# Patient Record
Sex: Male | Born: 1960 | Race: White | Hispanic: No | Marital: Married | Smoking: Never smoker
Health system: Southern US, Academic
[De-identification: ages and names within clinical notes are randomized; demographics above are authoritative.]

## PROBLEM LIST (undated history)

## (undated) DIAGNOSIS — I10 Essential (primary) hypertension: Secondary | ICD-10-CM

## (undated) DIAGNOSIS — E7800 Pure hypercholesterolemia, unspecified: Secondary | ICD-10-CM

## (undated) HISTORY — PX: HX KNEE ARTHROSCOPY: SHX127

## (undated) HISTORY — PX: HX TONSILLECTOMY: SHX27

---

## 2000-03-12 ENCOUNTER — Emergency Department (HOSPITAL_COMMUNITY): Payer: Self-pay | Admitting: Emergency Medicine

## 2020-09-09 IMAGING — MR MRI LUMBAR SPINE WITHOUT CONTRAST
6 series · 48 of 48 positions shown · IV contrast (gadolinium)
Comparison: None available.

﻿EXAM:  [id number]   MRI LUMBAR SPINE WITHOUT CONTRAST
INDICATION: Chronic lower back pain.
TECHNIQUE: Multiplanar multisequential MRI of the lumbar spine was performed without gadolinium contrast. A non conventional body coil was utilized due to patient's extremely large body habitus.

[Series 4: T2 · sagittal · 5.0mm · 1.00mm/px · 5 of 13 slices shown (1 of 3)]
[im 1/13]
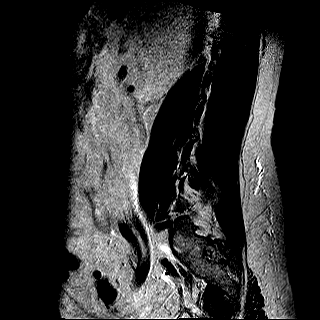
[im 4/13]
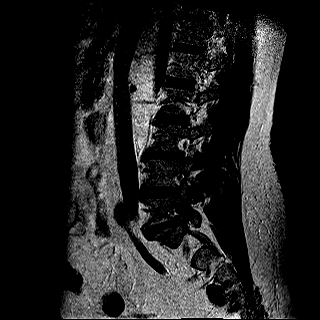
[im 7/13]
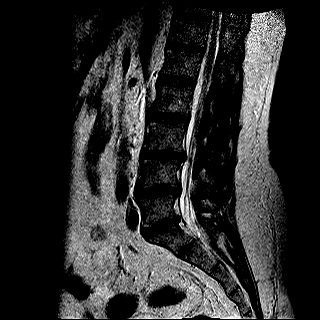
[im 10/13]
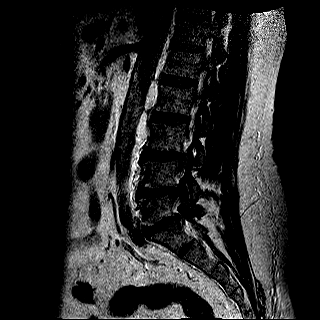
[im 13/13]
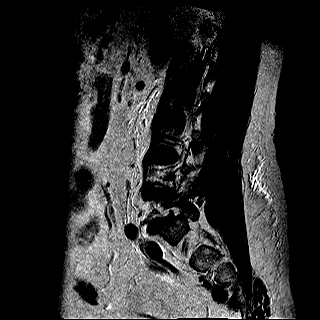

[Series 5: T1 · sagittal · 5.0mm · 1.00mm/px · 6 of 13 slices shown (1 of 2)]
[im 1/13]
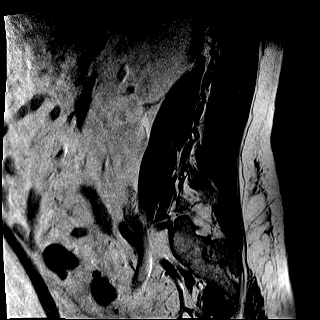
[im 3/13]
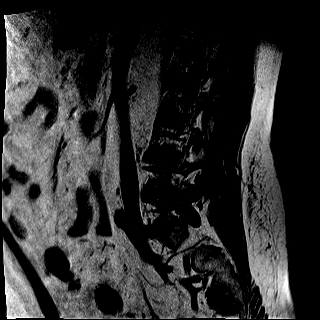
[im 5/13]
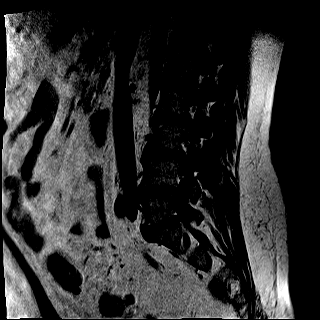
[im 8/13]
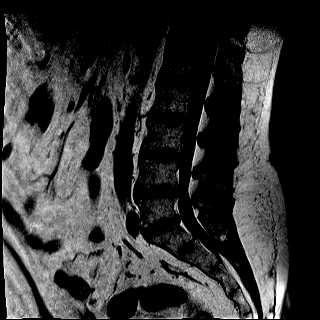
[im 10/13]
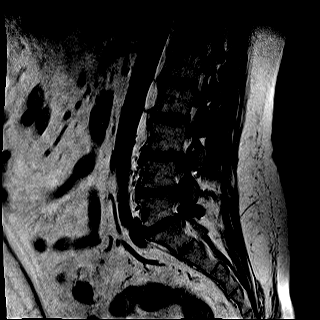
[im 13/13]
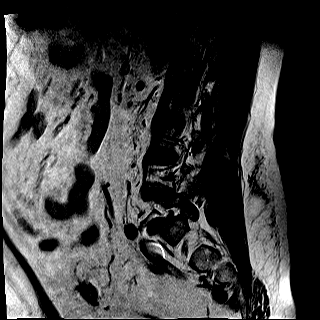

[Series 6: STIR · sagittal · 5.0mm · 1.25mm/px · 6 of 13 slices shown]
[im 1/13]
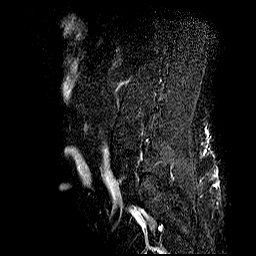
[im 3/13]
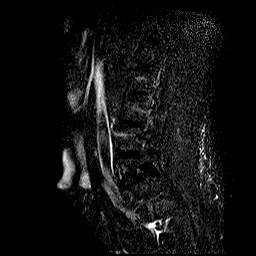
[im 5/13]
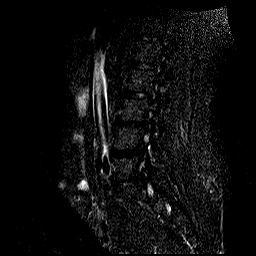
[im 8/13]
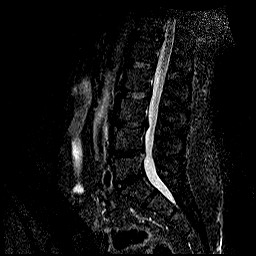
[im 10/13]
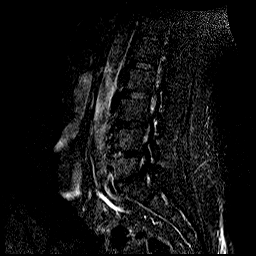
[im 13/13]
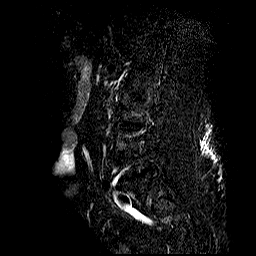

[Series 7: T2 · coronal · 4.0mm · 1.34mm/px · 9 of 20 slices shown (2 of 3)]
[im 1/20]
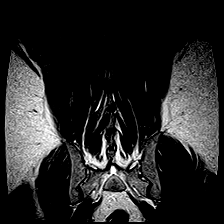
[im 3/20]
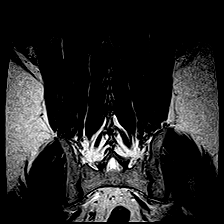
[im 5/20]
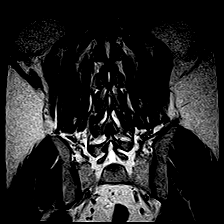
[im 8/20]
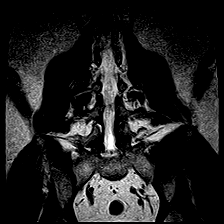
[im 10/20]
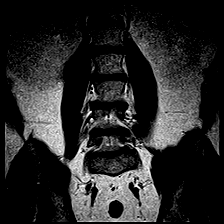
[im 12/20]
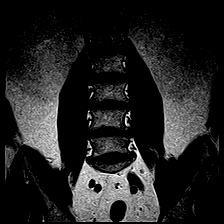
[im 15/20]
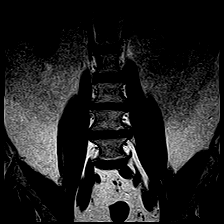
[im 17/20]
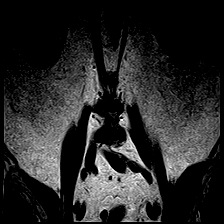
[im 20/20]
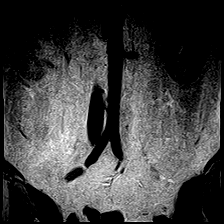

[Series 8: T2 · axial · 5.0mm · 0.89mm/px · z∈[-78,+136]mm · 11 of 25 slices shown (3 of 3)]
[im 1/25]
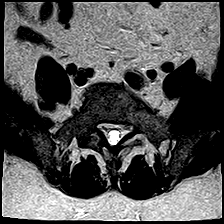
[im 3/25]
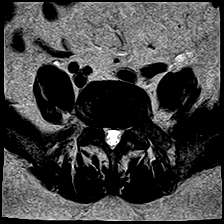
[im 5/25]
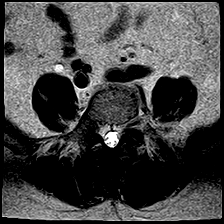
[im 8/25]
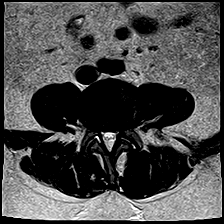
[im 10/25]
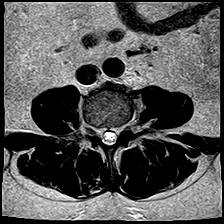
[im 13/25]
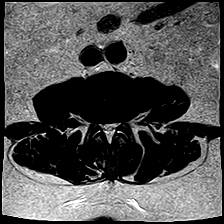
[im 15/25]
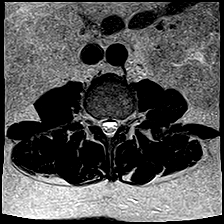
[im 17/25]
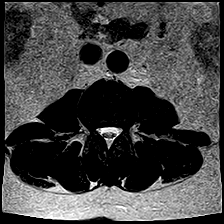
[im 20/25]
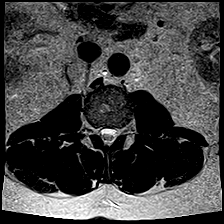
[im 22/25]
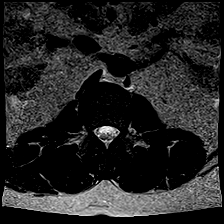
[im 25/25]
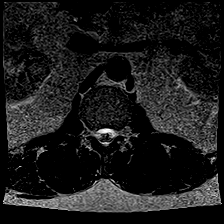

[Series 9: T1 · axial · 5.0mm · 0.89mm/px · z∈[-78,+136]mm · 11 of 25 slices shown (2 of 2)]
[im 1/25]
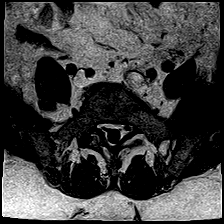
[im 3/25]
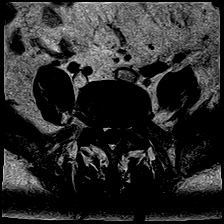
[im 5/25]
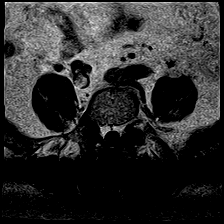
[im 8/25]
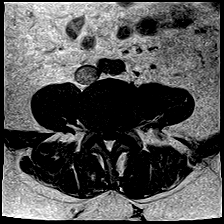
[im 10/25]
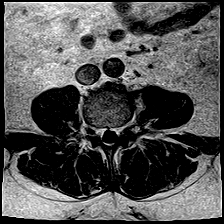
[im 13/25]
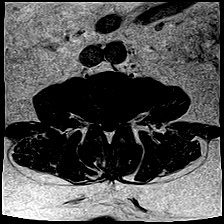
[im 15/25]
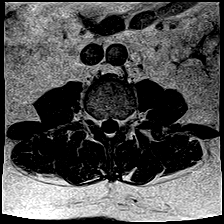
[im 17/25]
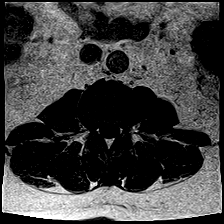
[im 20/25]
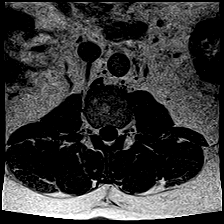
[im 22/25]
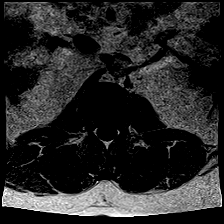
[im 25/25]
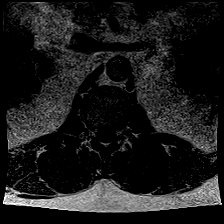

[48 of 48 positions shown; findings below may reference images not displayed]

FINDINGS: Vertebral bodies are normal in height, alignment and signal intensity. There is no acute fracture or subluxation. Distal spinal cord is normal in signal intensity and terminates normally at L1-2 disc space level. Spinal canal is congenitally narrow.

L1-2 level is unremarkable.

At L2-3 level, there is a minimal bulging annulus, minimally effacing the ventral thecal sac. There is mild bilateral neural foraminal stenosis from facet arthropathy and bulging annulus without nerve root impingement.

At L3-4 level, there is a small broad-based central disc bulge with associated epidural lipomatosis resulting in mild-to-moderate spinal stenosis. There is mild bilateral neural foraminal stenosis from facet arthropathy and bulging annulus.

At L4-5 level, there is a small broad-based central and left paracentral disc bulge resulting in moderate to severe left lateral recess compression. There is moderate bilateral neural foraminal stenosis from facet arthropathy and bulging annulus.

At L5-S1 level, there is a small broad-based central disc bulge without mass effect on the thecal sac. There is moderate bilateral neural foraminal stenosis from facet arthropathy and bulging annulus.

Paraspinal soft tissues are unremarkable.
IMPRESSION: 1. Mild-to-moderate spinal stenosis at L3-4 level from a small central disc bulge and associated epidural lipomatosis. 

2. Moderate to severe left lateral recess compression at L4-5 level from a small central and left paracentral disc bulge. 

3. Multilevel neural foraminal stenosis as detailed above.

## 2022-03-28 IMAGING — MR MRI KNEE LT W/O CONTRAST
5 series · 37 of 40 positions shown · IV contrast (gadolinium)
Comparison: None available.

﻿EXAM:  [id number]   MRI KNEE LT W/O CONTRAST
INDICATION: 61-year-old with history of trauma at work recently, sustaining twisting injury.  Medial knee pain and swelling.  Suspected internal derangement.
TECHNIQUE: Multiplanar, multisequential MRI of the left knee was performed without gadolinium contrast.  Spine coil was used due to the body habitus.

[Series 5: PD fat-sat · axial · left · 4.5mm · 0.37mm/px · z∈[-133,-8]mm · 7 of 26 slices shown (1 of 3)]
[im 1/26]
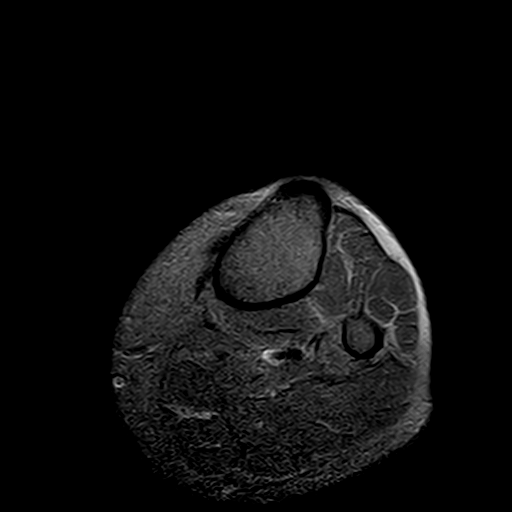
[im 5/26]
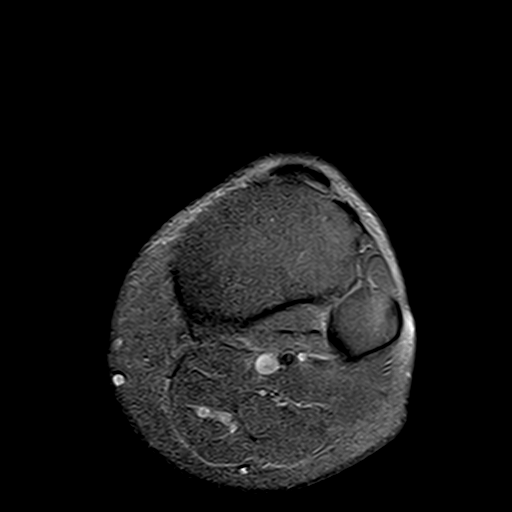
[im 9/26]
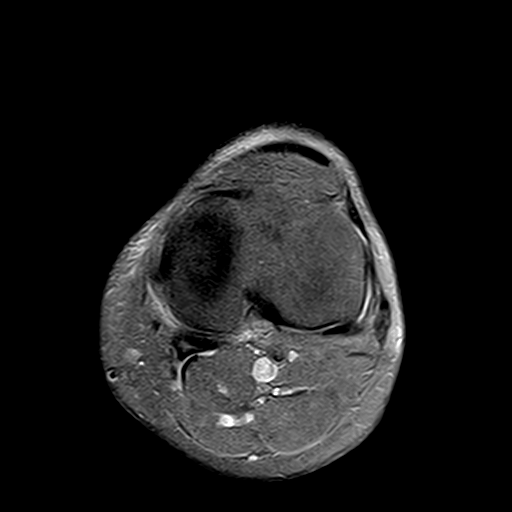
[im 13/26]
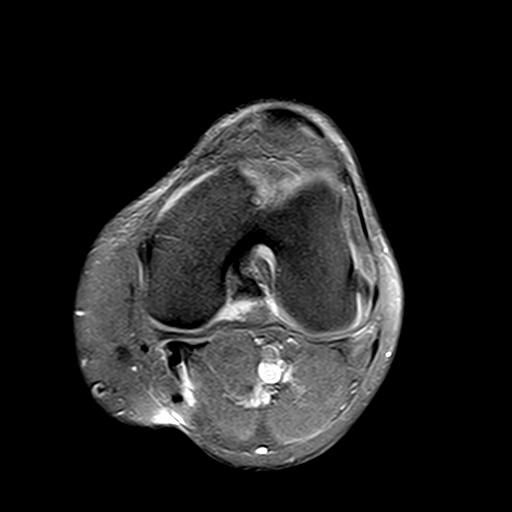
[im 17/26]
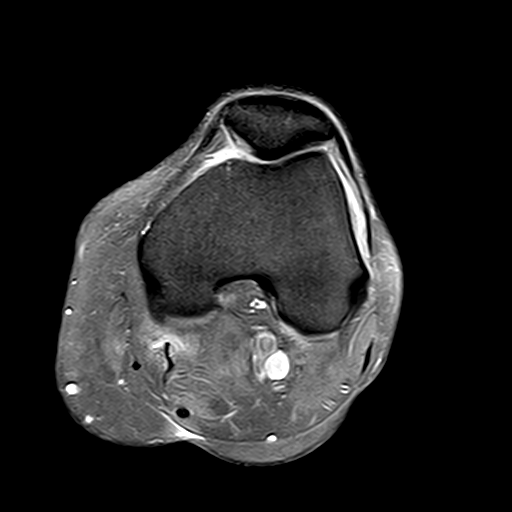
[im 21/26]
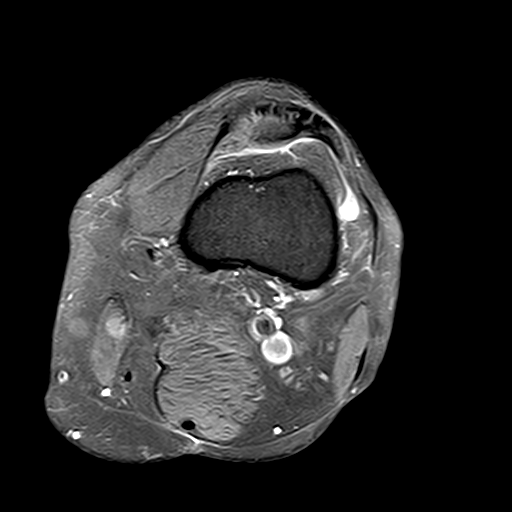
[im 26/26]
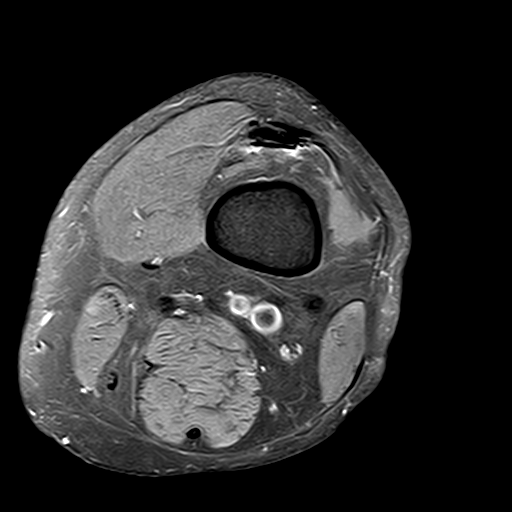

[Series 6: PD fat-sat · sagittal · left · 4.0mm · 0.53mm/px · 7 of 26 slices shown (2 of 3)]
[im 1/26]
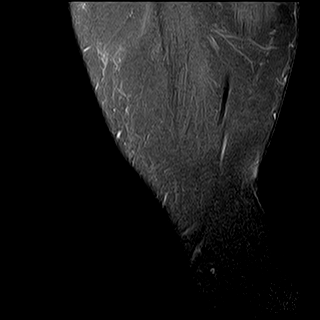
[im 5/26]
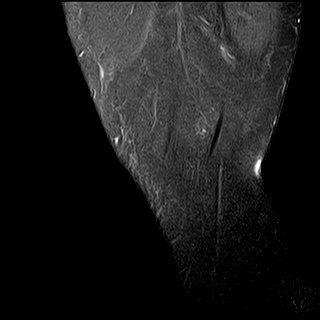
[im 9/26]
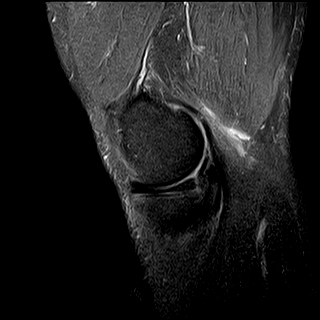
[im 13/26]
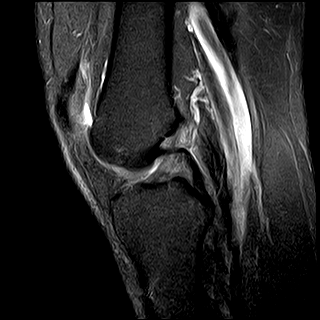
[im 17/26]
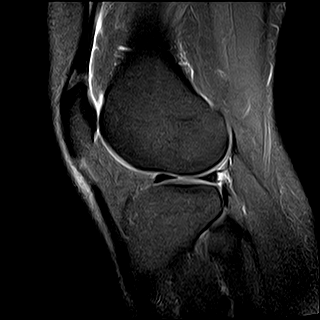
[im 21/26]
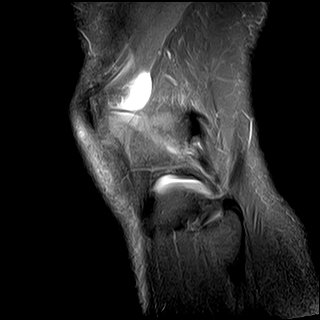
[im 26/26]
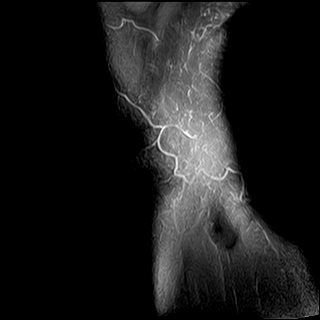

[Series 7: T1 · sagittal · left · 4.0mm · 0.53mm/px · 8 of 26 slices shown]
[im 1/26]
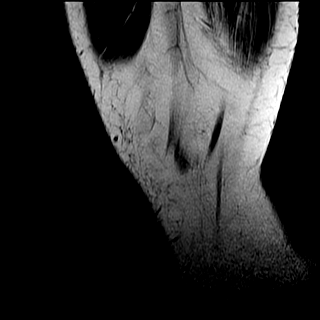
[im 4/26]
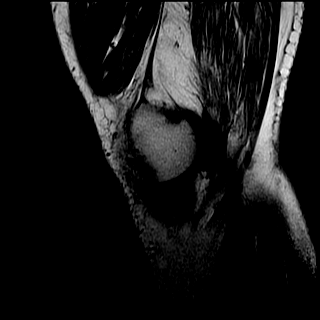
[im 8/26]
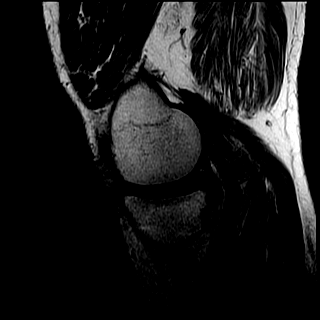
[im 11/26]
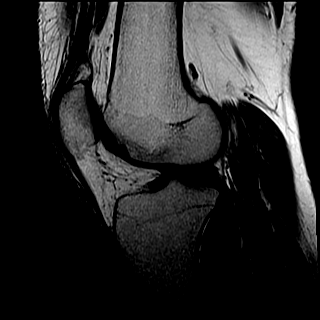
[im 15/26]
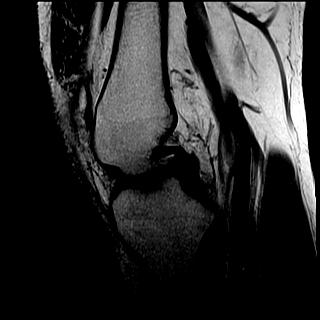
[im 18/26]
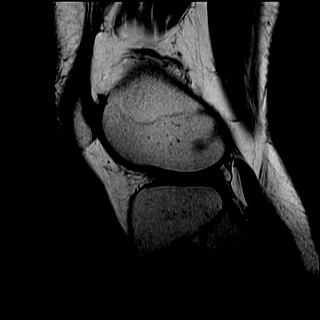
[im 22/26]
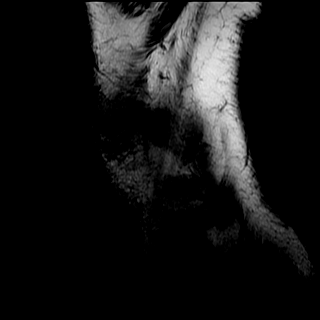
[im 26/26]
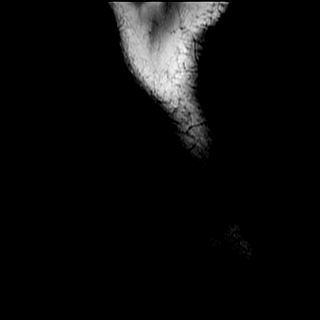

[Series 8: STIR · coronal · left · 4.0mm · 0.59mm/px · 6 of 30 slices shown]
[im 1/30]
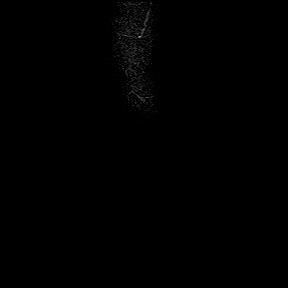
[im 4/30]
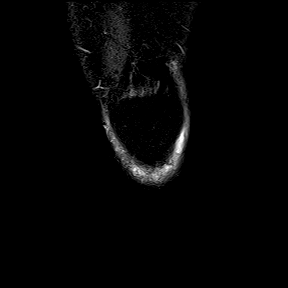
[im 8/30]
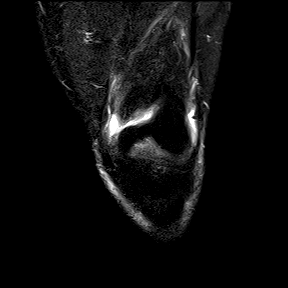
[im 11/30]
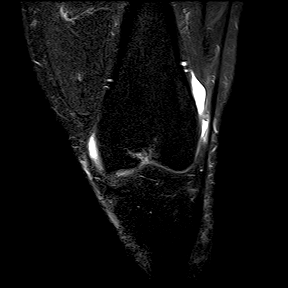
[im 19/30]
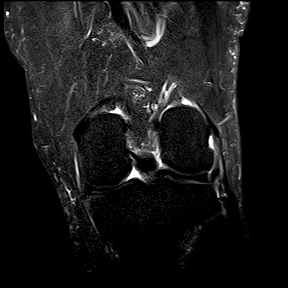
[im 22/30]
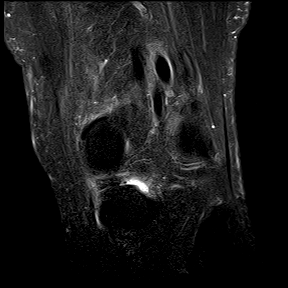

[Series 9: PD fat-sat · coronal · left · 4.0mm · 0.53mm/px · 9 of 30 slices shown (3 of 3)]
[im 1/30]
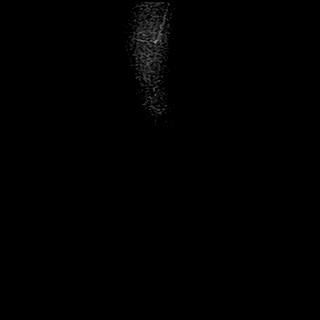
[im 4/30]
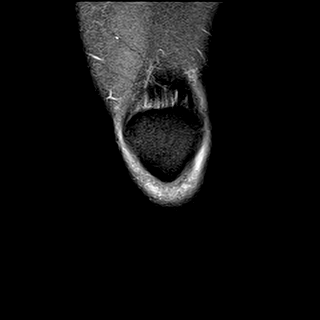
[im 8/30]
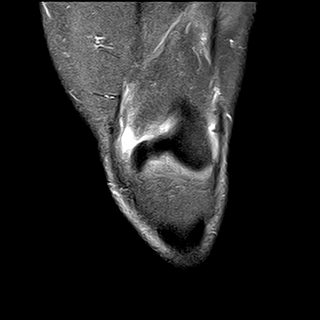
[im 11/30]
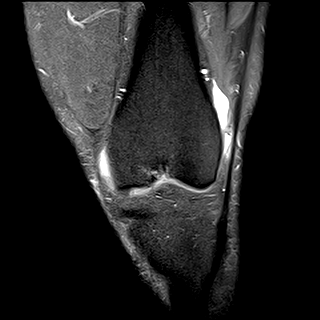
[im 15/30]
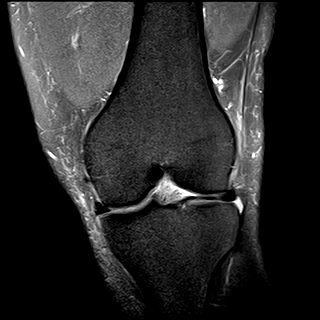
[im 19/30]
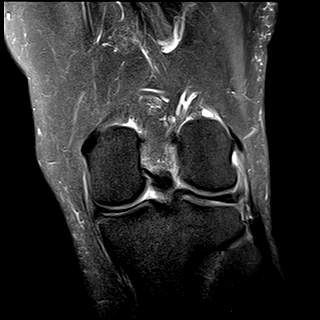
[im 22/30]
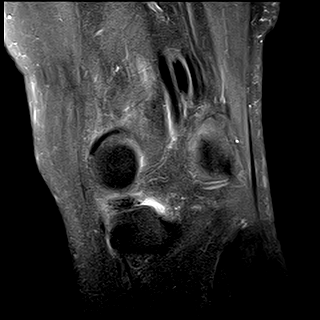
[im 26/30]
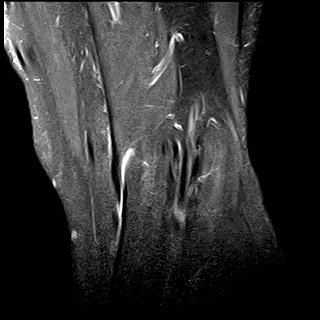
[im 30/30]
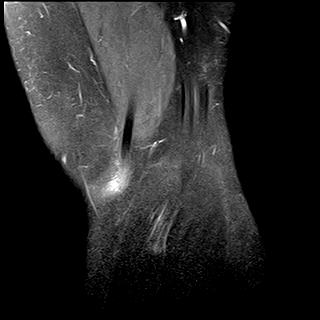

[37 of 40 positions shown; findings below may reference images not displayed]

FINDINGS: No acute bony lesions on both sides of the left knee. 

The lateral meniscus and lateral articular cartilage are intact.  The anterior and posterior cruciate ligaments are intact.

No meniscal tear of the medial meniscus is seen. Meniscal root is intact.

No focal abnormalities of the patellofemoral articulation.  Small effusion is noted in the knee joint. 

Mild soft tissue bruising and edema are noted over the medial collateral ligament.  However, there is no evidence of MCL disruption.  Lateral ligaments are intact. Quadriceps tendon and patellar tendon are intact.
IMPRESSION: 1. No acute fracture or bone bruise at the left knee.

2. No evidence of internal derangement of menisci, cruciate ligaments are seen. 

3. Bruising around the medial collateral ligament is noted with edema.  No evidence of disruption of the MCL.  Findings are suggestive of grade 1 sprained MCL.  

4. Small effusion in the knee joint.

## 2022-08-28 ENCOUNTER — Emergency Department
Admission: EM | Admit: 2022-08-28 | Discharge: 2022-08-28 | Disposition: A | Discharge status: Home / Self Care | Payer: 59 | Attending: Emergency Medicine | Admitting: Emergency Medicine

## 2022-08-28 ENCOUNTER — Emergency Department (HOSPITAL_COMMUNITY): Payer: 59

## 2022-08-28 ENCOUNTER — Encounter (HOSPITAL_COMMUNITY): Payer: Self-pay

## 2022-08-28 ENCOUNTER — Other Ambulatory Visit: Payer: Self-pay

## 2022-08-28 DIAGNOSIS — S46911A Strain of unspecified muscle, fascia and tendon at shoulder and upper arm level, right arm, initial encounter: Secondary | ICD-10-CM

## 2022-08-28 DIAGNOSIS — W19XXXA Unspecified fall, initial encounter: Secondary | ICD-10-CM | POA: Insufficient documentation

## 2022-08-28 HISTORY — DX: Essential (primary) hypertension: I10

## 2022-08-28 HISTORY — DX: Pure hypercholesterolemia, unspecified: E78.00

## 2022-08-28 MED ORDER — TRAMADOL 50 MG TABLET
2.0000 | ORAL_TABLET | Freq: Four times a day (QID) | ORAL | 0 refills | Status: AC | PRN
Start: 2022-08-28 — End: 2022-09-02

## 2022-08-28 MED ORDER — FENTANYL (PF) 50 MCG/ML INJECTION SOLUTION
50.0000 ug | INTRAMUSCULAR | Status: AC
Start: 2022-08-28 — End: 2022-08-28
  Administered 2022-08-28: 50 ug via INTRAVENOUS

## 2022-08-28 MED ORDER — ONDANSETRON HCL (PF) 4 MG/2 ML INJECTION SOLUTION
4.0000 mg | INTRAMUSCULAR | Status: AC
Start: 2022-08-28 — End: 2022-08-28
  Administered 2022-08-28: 4 mg via INTRAVENOUS

## 2022-08-28 MED ORDER — ONDANSETRON HCL (PF) 4 MG/2 ML INJECTION SOLUTION
INTRAMUSCULAR | Status: AC
Start: 2022-08-28 — End: 2022-08-28
  Filled 2022-08-28: qty 2

## 2022-08-28 MED ORDER — FENTANYL (PF) 50 MCG/ML INJECTION SOLUTION
INTRAMUSCULAR | Status: AC
Start: 2022-08-28 — End: 2022-08-28
  Filled 2022-08-28: qty 2

## 2022-08-28 NOTE — ED Nurses Note (Signed)
Patient to ed room 12 with c/o falling down a hill and hitting his shoulder. Obvious deformity of the left shoulder ntd. Last known meal was at 1430.

## 2022-08-28 NOTE — ED Nurses Note (Addendum)
Sling applied to patient right arm.

## 2022-08-28 NOTE — ED Triage Notes (Signed)
C/O R SHOULDER PAIN AFTER FALL AT APPROX 1700. NOTED DEFORMITY.

## 2022-08-28 NOTE — Discharge Instructions (Addendum)
THE PATIENT IS TO FOLLOW UP WITH THE PRIMARY CARE PROVIDER AS SOON AS POSSIBLE BUT NO LATER THAN 3 DAYS FROM LEAVING THE EMERGENCY DEPARTMENT.  IF NO PRIMARY CARE PROVIDER EXISTS, THEN THE PATIENT IS INSTRUCTED TO ESTABLISH CARE WITH A PRIMARY CARE PROVIDER AS SOON AS POSSIBLE BUT NO LATER THAN 3 DAYS FROM LEAVING THE EMERGENCY DEPARTMENT.  FOLLOW-UP WITH ANY SPECIALIST PROVIDER AS INDICATED AS SOON AS POSSIBLE BUT NO LATER THAN 3 DAYS, IF APPLICABLE.  NOTIFY THE PRIMARY CARE PROVIDER THAT YOU WERE IN THE EMERGENCY DEPARTMENT WITHIN 24 HOURS OF DISCHARGE TO FOLLOW-UP ON YOUR RESULTS AND/OR TREATMENTS.  RETURN TO THE EMERGENCY DEPARTMENT IMMEDIATELY IF NEEDED, NO BETTER, WORSE, NEW SYMPTOMS ARISE, OR YOU CANNOT FOLLOW-UP WITH YOUR PRIMARY CARE PROVIDER AND/OR APPLICABLE SPECIALIST IN THE PRESCRIBED TIMEFRAME.      CAN CALL DR. MORGAN'S OFFICE IN THE MORNING TO GET AN APPOINTMENT.

## 2022-08-28 NOTE — ED Provider Notes (Signed)
[facility]  Emergency Department  Attending Provider Note      CHIEF COMPLAINT  Chief Complaint   Patient presents with    Shoulder Pain     HISTORY OF PRESENT ILLNESS  Arthur Campbell, date of birth January 26, 1961, is a 62 y.o. male who presented to the Emergency Department.    PATIENT FELL PRIOR TO ARRIVAL WITH INJURY TO THE RIGHT SHOULDER COMPLEX.  SYMPTOMS WORSE WITH ACTIVITY BETTER WITH REST MODERATE IN NATURE.  CONCERN FOR DISLOCATION.  NO FEVERS CHILLS CHEST PAIN PALPITATION SHORTNESS OF BREATH COUGH DYSURIA HEMATURIA NAUSEA VOMITING.  NO LOSS OF CONSCIOUSNESS OR HEAD TRAUMA.  COMPREHENSIVE 10+ REVIEW OF SYSTEMS OTHERWISE NEGATIVE.  THERE ARE NO OTHER ACUTE COMPLAINTS REPORTED BY THE PATIENT.    PAST MEDICAL/SURGICAL/FAMILY/SOCIAL HISTORY  Past Medical History:   Diagnosis Date    High cholesterol     HTN (hypertension)        Past Surgical History:   Procedure Laterality Date    HX KNEE ARTHROSCOPY      HX TONSILLECTOMY         Family Medical History:    None       Social History     Socioeconomic History    Marital status: Married   Tobacco Use    Smoking status: Never    Smokeless tobacco: Never   Substance and Sexual Activity    Alcohol use: Never    Drug use: Never      ALLERGIES  Allergies   Allergen Reactions    Codeine Anaphylaxis       PHYSICAL EXAM  VITAL SIGNS:  Filed Vitals:    08/28/22 1726   BP: (!) 166/97   Pulse: 84   Resp: 20   Temp: 36.8 C (98.2 F)   SpO2: 97%     GENERAL: PATIENT IS ALERT AND ORIENTED TO PERSON, PLACE, AND TIME.  HEAD: NORMOCEPHALIC AND ATRAUMATIC.  EYES: PUPILS EQUALLY ROUND AND REACT TO LIGHT. EXTRAOCULAR MOVEMENTS INTACT.  EARS: GROSS HEARING INTACT. EXTERNAL EARS WITHIN NORMAL LIMITS.  NOSE: NO SEPTAL DEVIATION. NASAL PASSAGES CLEAR.  THROAT: MOIST ORAL MUCOSA.   NECK: SUPPLE. TRACHEA MIDLINE.  HEART: REGULAR, RATE, AND RHYTHM.  LUNGS: CLEAR TO AUSCULTATION BILATERAL BUT DECREASED.  ABDOMEN: SOFT, NON-TENDER, NON-DISTENDED, AND BOWEL SOUNDS ARE PRESENT.   OVERWEIGHT.  GENITOURINARY: DEFERRED.  RECTAL: DEFERRED.  EXTREMITIES: NO CYANOSIS, CLUBBING, OR EDEMA.  SKIN: WARM AND DRY.  MUSCULOSKELETAL:  PAIN TO PALPATION OF THE RIGHT SHOULDER REGION WITH DECREASED RANGE OF MOTION.  NEUROLOGIC: CRANIAL NERVES II THROUGH XII ARE GROSSLY INTACT. SENSATION TO LIGHT TOUCH IS INTACT.  PSYCHIATRIC: JUDGMENT AND INSIGHT ARE SEEMINGLY INTACT. MOOD AND AFFECT ARE APPROPRIATE FOR THE SITUATION.      DIAGNOSTICS  Labs:  Labs listed below were reviewed and interpreted by me.  No results found for any visits on 08/28/22.  Radiology:  Results for orders placed or performed during the hospital encounter of 08/28/22   XR SHOULDER RIGHT     Status: None    Narrative    Temple A Tinnel    RADIOLOGIST: Markus Jarvis, MD    XR SHOULDER RIGHT performed on 08/28/2022 6:09 PM    CLINICAL HISTORY: FALL.  RIGHT SHOULDER PAIN, FALL    TECHNIQUE: 3 view(s) of the right shoulder    COMPARISON:  None.    FINDINGS:   Small bone fragments can be seen distal to the clavicle.  There is malalignment of the acromioclavicular joint with surrounding  soft tissue swelling.  Remaining bony structures and surrounding soft tissues were unremarkable.        Impression    PROBABLE ACUTE AC SEPARATION GIVEN THE HISTORY WITH SURROUNDING SOFT TISSUE SWELLING.           Radiologist location ID: [id number]     XR CHEST AP     Status: None    Narrative    Farid A Vanderbeck    RADIOLOGIST: Markus Jarvis, MD    XR CHEST AP performed on 08/28/2022 6:10 PM    CLINICAL HISTORY: FALL.  RIGHT SHOULDER PAIN, FALL    TECHNIQUE: Frontal view of the chest.    COMPARISON:  Yesterday    FINDINGS:    The heart size is normal.   The lungs are clear.           Impression    NO ACUTE FINDINGS.        Radiologist location ID: [id number]         ED COURSE/MEDICAL DECISION MAKING  Medications Administered in the ED   fentaNYL (SUBLIMAZE) 50 mcg/mL injection (50 mcg Intravenous Given 08/28/22 1800)   ondansetron (ZOFRAN) 2 mg/mL  injection (4 mg Intravenous Given 08/28/22 1810)      ED Course as of 08/28/22 1902   Sun Aug 28, 2022   1824 XR CHEST AP  TODAY I, Lyann Hagstrom A. Jwan Hornbaker, D.O., PERSONALLY VISUALIZED THE PATIENT'S DIAGNOSTIC IMAGING STUDIES.  DEFER TO THE RADIOLOGIST'S REPORT FOR THE FINAL DEFINITIVE RADIOLOGY READING.  MY INITIAL INDEPENDENT INTERPRETATION IS AS FOLLOWS, BUT NOT LIMITED TO:  NO OBVIOUS ACUTE CARDIOPULMONARY PATHOLOGY        Medical Decision Making  SPOKE WITH ORTHOPEDIC SURGERY, GREG, WHO STATED PLACE THE PATIENT IN HIS SLING IN THE PATIENT CAN FOLLOW UP IN THE OFFICE TOMORROW AFTER CALLING TO MAKE AN APPOINTMENT.    Problems Addressed:  Fall: acute illness or injury  Strain of AC joint, right, initial encounter: acute illness or injury    Amount and/or Complexity of Data Reviewed  Independent Historian: spouse  Radiology: ordered and independent interpretation performed. Decision-making details documented in ED Course.    Risk  Prescription drug management.  Parenteral controlled substances.  Diagnosis or treatment significantly limited by social determinants of health.      CLINICAL IMPRESSION  Clinical Impression   Strain of AC joint, right, initial encounter (Primary)   Fall     DISPOSITION  Discharged       DISCHARGE MEDICATIONS  Current Discharge Medication List        START taking these medications    Details   traMADoL (ULTRAM) 50 mg Oral Tablet Take 2 Tablets (100 mg total) by mouth Every 6 hours as needed for Pain for up to 5 days  Qty: 40 Tablet, Refills: 0             /Khalie Wince A. Earlene Plater, DO, MBA   08/28/2022, 18:58   [facility]  Department of Emergency Medicine  [facility]    This note was partially generated using MModal Fluency Direct system, and there may be some incorrect words, spellings, and punctuation that were not noted in checking the note before saving.

## 2022-08-28 NOTE — ED Nurses Note (Signed)
DISCHARGE INSTRUCTIONS AND RX GIVEN, IV REMOVED, BLEEDING CONTROLLED, BANDAGE APPLIED.  VERBALIZED UNDERSTANDING

## 2022-08-29 ENCOUNTER — Other Ambulatory Visit (HOSPITAL_COMMUNITY): Payer: Self-pay | Admitting: Family Medicine

## 2022-08-29 DIAGNOSIS — S43101S Unspecified dislocation of right acromioclavicular joint, sequela: Secondary | ICD-10-CM

## 2022-09-13 ENCOUNTER — Ambulatory Visit (HOSPITAL_COMMUNITY): Payer: Self-pay

## 2022-09-13 ENCOUNTER — Inpatient Hospital Stay (HOSPITAL_COMMUNITY): Admission: RE | Admit: 2022-09-13 | Payer: 59 | Source: Ambulatory Visit

## 2022-12-09 IMAGING — MR MRI LUMBAR SPINE WITHOUT CONTRAST
6 series · 48 of 48 positions shown · IV contrast (gadolinium)
Comparison: MRI dated 09/09/2020.

﻿EXAM:  [id number]   MRI LUMBAR SPINE WITHOUT CONTRAST
INDICATION: Chronic low back pain.  Radicular symptoms to lower extremity with weakness.  Trauma due to fall 2 years ago. No history of malignancy or back surgery.
TECHNIQUE: Multiplanar, multisequential MRI of the lumbosacral spine was performed without gadolinium contrast.  Overall visualization is suboptimal due to large body habitus and the use of body coil instead of spine coil due to the large body habitus.  Overall visualization is acceptable.

[Series 7: T2 · sagittal · 5.0mm · 1.00mm/px · 6 of 13 slices shown (1 of 3)]
[im 1/13]
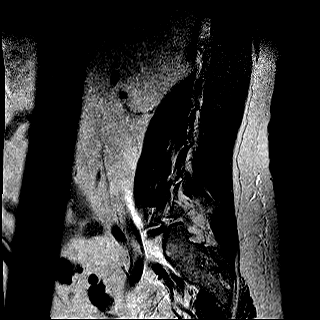
[im 3/13]
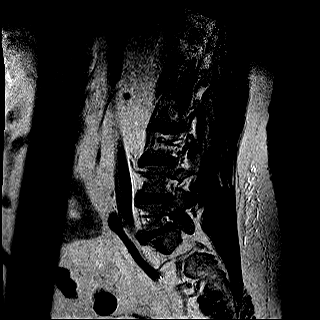
[im 5/13]
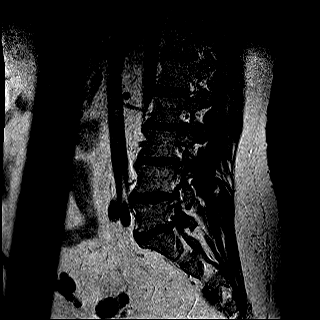
[im 8/13]
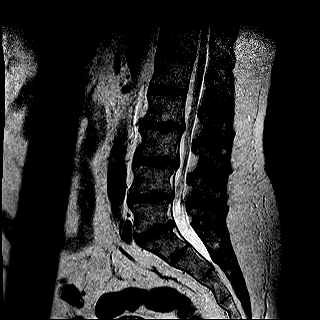
[im 10/13]
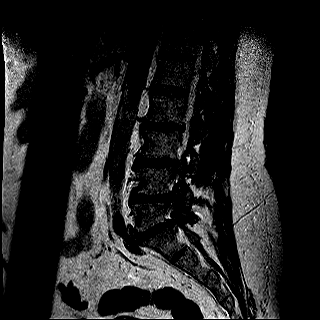
[im 13/13]
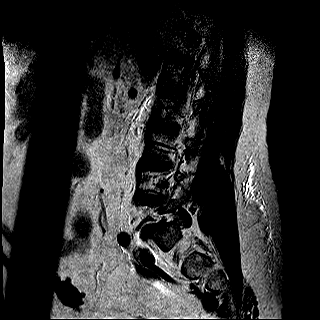

[Series 8: T1 · sagittal · 5.0mm · 1.00mm/px · 6 of 13 slices shown (1 of 2)]
[im 1/13]
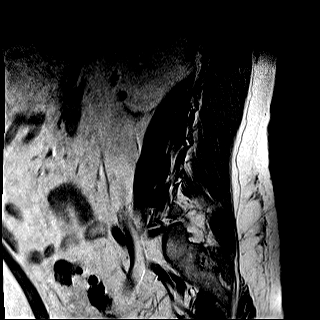
[im 3/13]
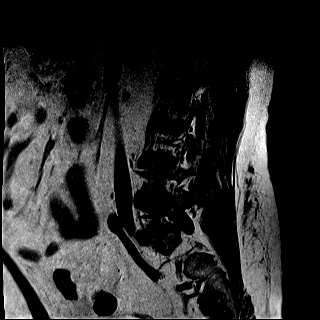
[im 5/13]
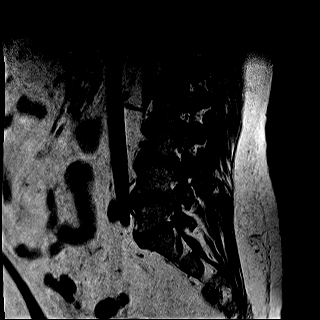
[im 8/13]
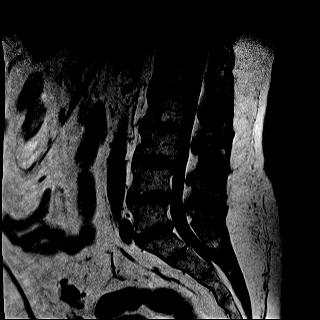
[im 10/13]
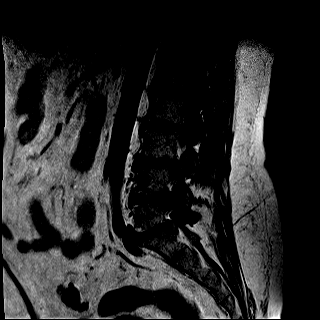
[im 13/13]
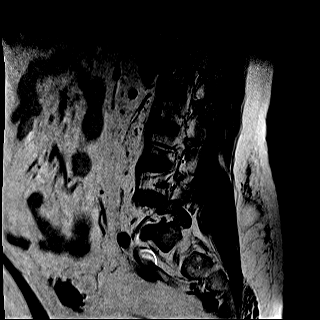

[Series 9: STIR · sagittal · 5.0mm · 1.25mm/px · 6 of 13 slices shown]
[im 1/13]
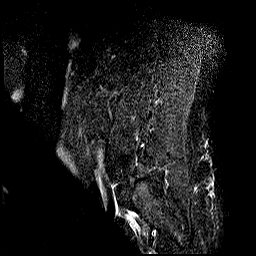
[im 3/13]
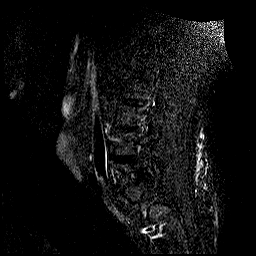
[im 5/13]
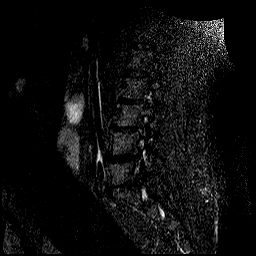
[im 8/13]
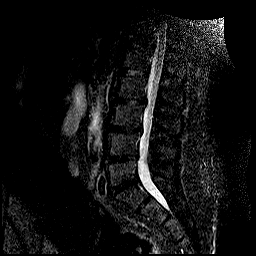
[im 10/13]
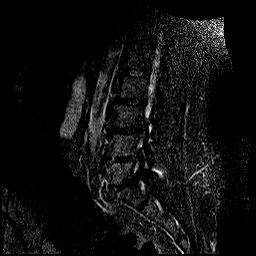
[im 13/13]
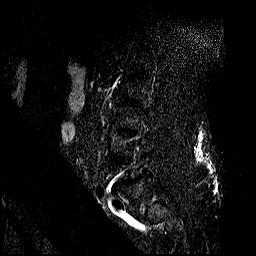

[Series 10: T2 · coronal · 4.0mm · 1.88mm/px · 10 of 20 slices shown (2 of 3)]
[im 1/20]
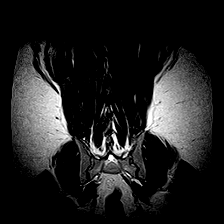
[im 3/20]
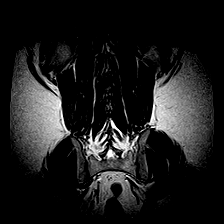
[im 5/20]
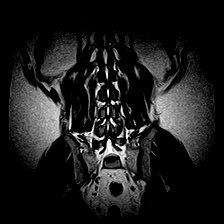
[im 7/20]
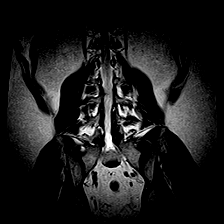
[im 9/20]
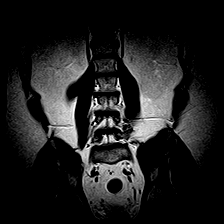
[im 11/20]
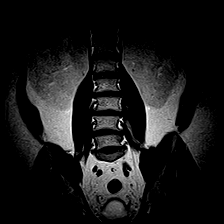
[im 13/20]
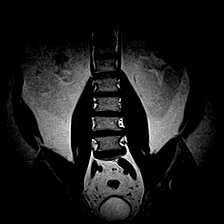
[im 15/20]
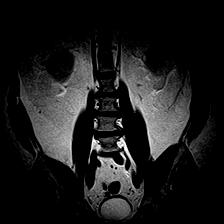
[im 17/20]
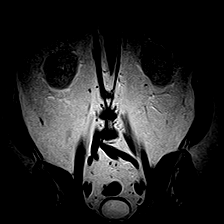
[im 20/20]
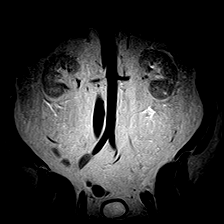

[Series 11: T2 · axial · 5.0mm · 0.89mm/px · z∈[-52,+180]mm · 10 of 21 slices shown (3 of 3)]
[im 1/21]
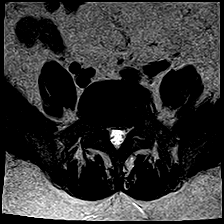
[im 3/21]
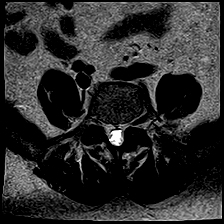
[im 5/21]
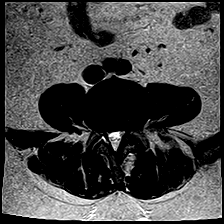
[im 7/21]
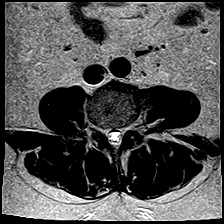
[im 9/21]
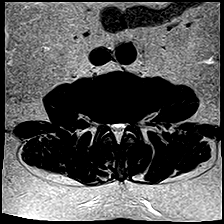
[im 12/21]
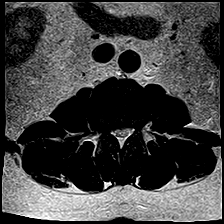
[im 14/21]
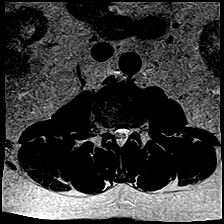
[im 16/21]
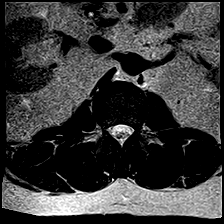
[im 18/21]
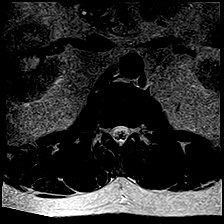
[im 21/21]
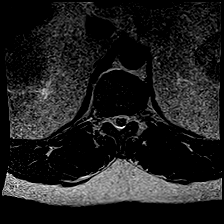

[Series 12: T1 · axial · 5.0mm · 0.89mm/px · z∈[-52,+180]mm · 10 of 21 slices shown (2 of 2)]
[im 1/21]
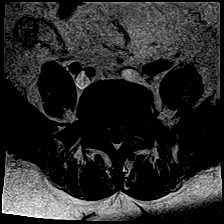
[im 3/21]
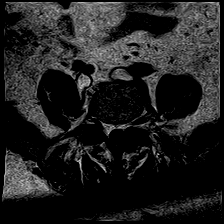
[im 5/21]
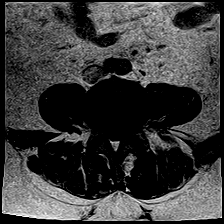
[im 7/21]
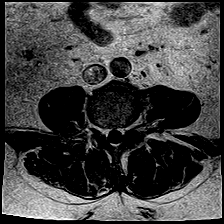
[im 9/21]
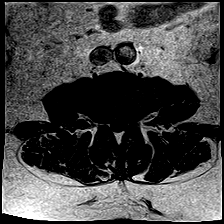
[im 12/21]
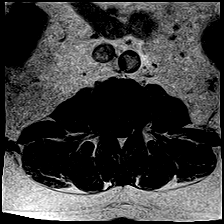
[im 14/21]
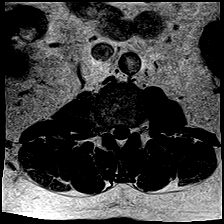
[im 16/21]
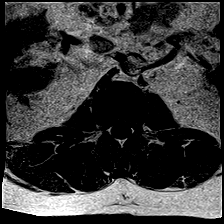
[im 18/21]
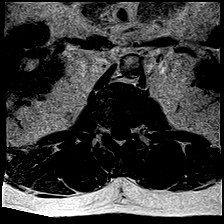
[im 21/21]
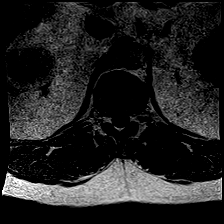

[48 of 48 positions shown; findings below may reference images not displayed]

FINDINGS: No focal bone changes of lumbar vertebrae.  Conus terminates at L1-2 level.  Cauda equina structures are normal. 

At L1-2 level, mild facet arthropathy is noted without compromise of thecal sac or neural foramina. 

At L2-3 level, significant degenerative disc disease is noted with asymmetric bulging annulus to the right along with facet arthropathy causing severe compromise of right neural foramen. Compromise of thecal sac in the midline with AP diameter measuring 9.4 mm. Degenerative changes at L2-3 level are more prominent compared with 09/09/2020.

At L3-4 level, degenerative disc disease with bulging annulus and facet arthropathy are causing mild compromise of thecal sac and moderate to significant compromise of lateral recess and neural foramina on both sides. AP diameter of thecal sac in the midline measures 8.7 mm. Findings at L3-4 level are unchanged from prior study. 

At L4-5 level, significant degenerative disc disease with bulging annulus is noted with minimal retrolisthesis.  This along with facet arthropathy is causing significant compromise of both neural foramina and lateral recess.  Mild compromise of thecal sac with the AP diameter in the midline measuring 10 mm.  Findings at this level are unchanged from 09/09/2020. 

At L5-S1 level, significant degenerative disc disease is noted with bulging annulus and facet arthropathy causing severe biforaminal stenosis.  Findings at this level are unchanged from 09/09/2020.
IMPRESSION: 1. No focal or acute bone changes of lumbar vertebrae. 

2. Multilevel degenerative changes as described above in detail at each level.  The findings are more prominent at L2-3 level compared with 09/09/2020.
